# Patient Record
Sex: Male | Born: 1990 | Hispanic: No | Marital: Single | Smoking: Never smoker
Health system: Southern US, Community
[De-identification: ages and names within clinical notes are randomized; demographics above are authoritative.]

---

## 2014-06-30 ENCOUNTER — Emergency Department (INDEPENDENT_AMBULATORY_CARE_PROVIDER_SITE_OTHER): Payer: BLUE CROSS/BLUE SHIELD

## 2014-06-30 ENCOUNTER — Encounter (HOSPITAL_COMMUNITY): Payer: Self-pay | Admitting: Emergency Medicine

## 2014-06-30 ENCOUNTER — Emergency Department (INDEPENDENT_AMBULATORY_CARE_PROVIDER_SITE_OTHER)
Admission: EM | Admit: 2014-06-30 | Discharge: 2014-06-30 | Disposition: A | Discharge status: Home / Self Care | Payer: BLUE CROSS/BLUE SHIELD | Source: Home / Self Care | Attending: Family Medicine | Admitting: Family Medicine

## 2014-06-30 DIAGNOSIS — S62502A Fracture of unspecified phalanx of left thumb, initial encounter for closed fracture: Secondary | ICD-10-CM

## 2014-06-30 DIAGNOSIS — S62609A Fracture of unspecified phalanx of unspecified finger, initial encounter for closed fracture: Secondary | ICD-10-CM

## 2014-06-30 NOTE — Discharge Instructions (Signed)
Leave thumb splinted, see orthopedist this week for recheck.

## 2014-06-30 NOTE — ED Notes (Signed)
Reportedly injured left thumb when attempting to catch a ball yesterday PM. Pain,discoloration, swelling DIP left thumb

## 2014-06-30 NOTE — ED Provider Notes (Signed)
CSN: [id number]     Arrival date & time 06/30/14  1545 History   First MD Initiated Contact with Patient 06/30/14 1615     Chief Complaint  Patient presents with  . Finger Injury   (Consider location/radiation/quality/duration/timing/severity/associated sxs/prior Treatment) Patient is a 23 y.o. male presenting with hand injury. The history is provided by the patient.  Hand Injury Location:  Finger Time since incident:  1 day Injury: yes   Mechanism of injury comment:  Struck with cricket ball to left thumb. Finger location:  L thumb Pain details:    Quality:  Throbbing   Severity:  Moderate   Onset quality:  Sudden Prior injury to area:  No Associated symptoms: stiffness and swelling     History reviewed. No pertinent past medical history. History reviewed. No pertinent past surgical history. History reviewed. No pertinent family history. History  Substance Use Topics  . Smoking status: Never Smoker   . Smokeless tobacco: Not on file  . Alcohol Use: No    Review of Systems  Constitutional: Negative.   Musculoskeletal: Positive for joint swelling and stiffness.  Skin: Negative for wound.    Allergies  Review of patient's allergies indicates no known allergies.  Home Medications   Prior to Admission medications   Not on File   BP 114/77  Pulse 58  Temp(Src) 97.8 F (36.6 C) (Oral)  Resp 14  SpO2 100% Physical Exam  Nursing note and vitals reviewed. Constitutional: He is oriented to person, place, and time. He appears well-developed and well-nourished.  Musculoskeletal: He exhibits tenderness.       Hands: Neurological: He is alert and oriented to person, place, and time.  Skin: Skin is warm and dry.    ED Course  Procedures (including critical care time) Labs Review Labs Reviewed - No data to display  Imaging Review Dg Finger Thumb Left  06/30/2014   CLINICAL DATA:  Baseball injury to the left thumb. Pain in the interphalangeal joint.  EXAM: LEFT  THUMB 2+V  COMPARISON:  None.  FINDINGS: Oblique fracture of the distal phalanx of the thumb extends from the volar metaphysis to the proximal articular surface. Adjacent soft tissue swelling noted. The distal phalanx appears slightly radially subluxed on the proximal phalanx, but certainly not dislocated.  IMPRESSION: 1. Proximal fracture of the distal phalanx of the thumb, extending from the volar proximal metaphysis to the midportion of the proximal articular surface. Adjacent soft tissue swelling. Very slight radial subluxation of the distal phalanx on the middle phalanx may indicate a collateral ligament injury.   Electronically Signed   By: Herbie Baltimore M.D.   On: 06/30/2014 16:40   X-rays reviewed and report per radiologist.   MDM   1. Thumb fracture, left, closed, initial encounter        Linna Hoff, MD 06/30/14 [phone]

## 2015-10-01 IMAGING — CR DG FINGER THUMB 2+V*L*
3 series · 3 of 3 positions shown · non-contrast
Comparison: None.

CLINICAL DATA: Baseball injury to the left thumb. Pain in the
interphalangeal joint.

EXAM:
LEFT THUMB 2+V

[finger ap]
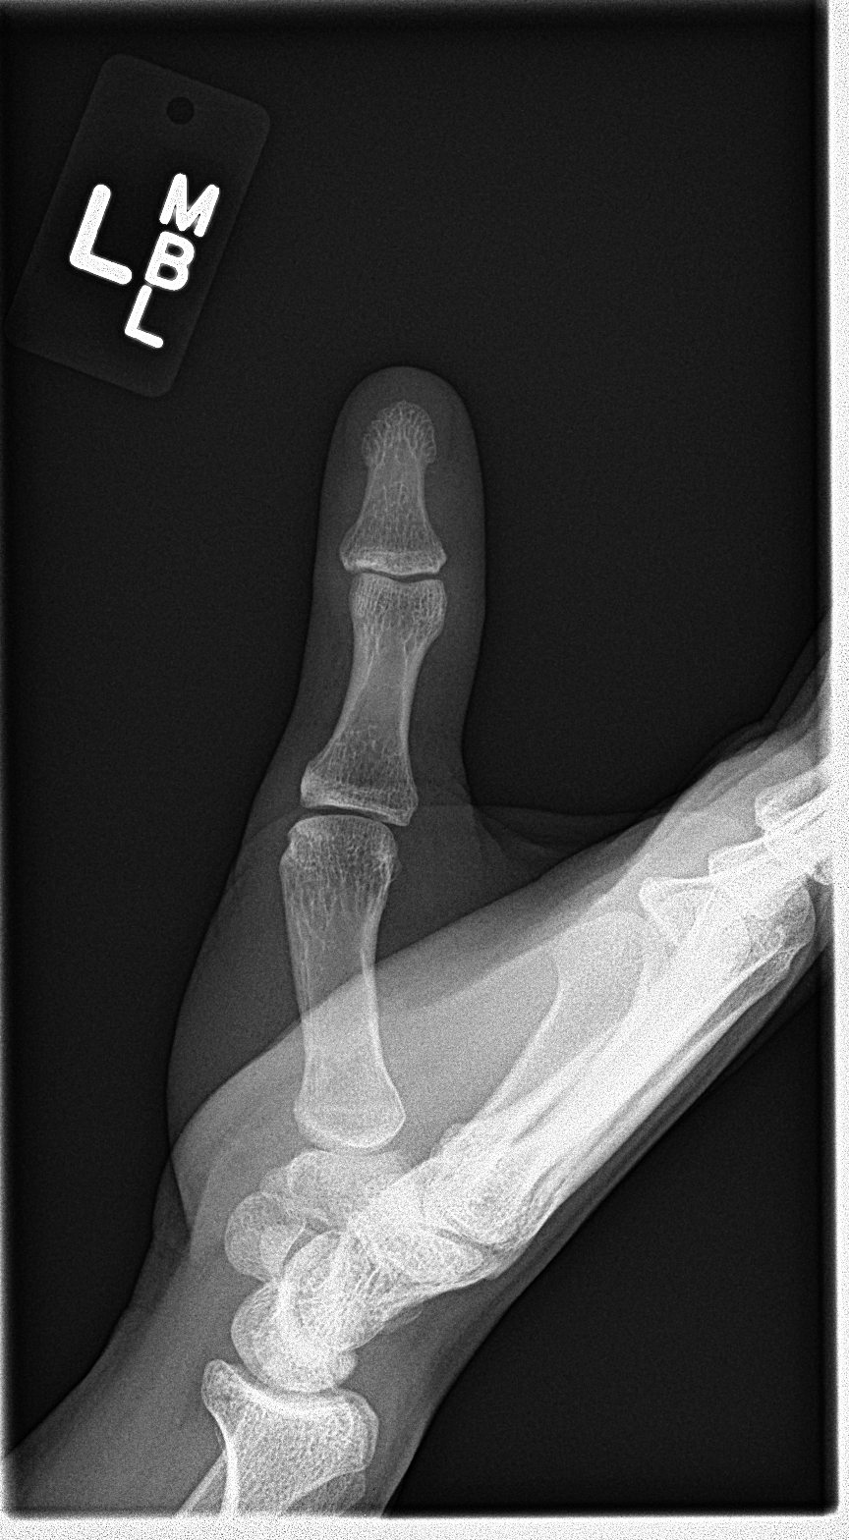

[finger lat]
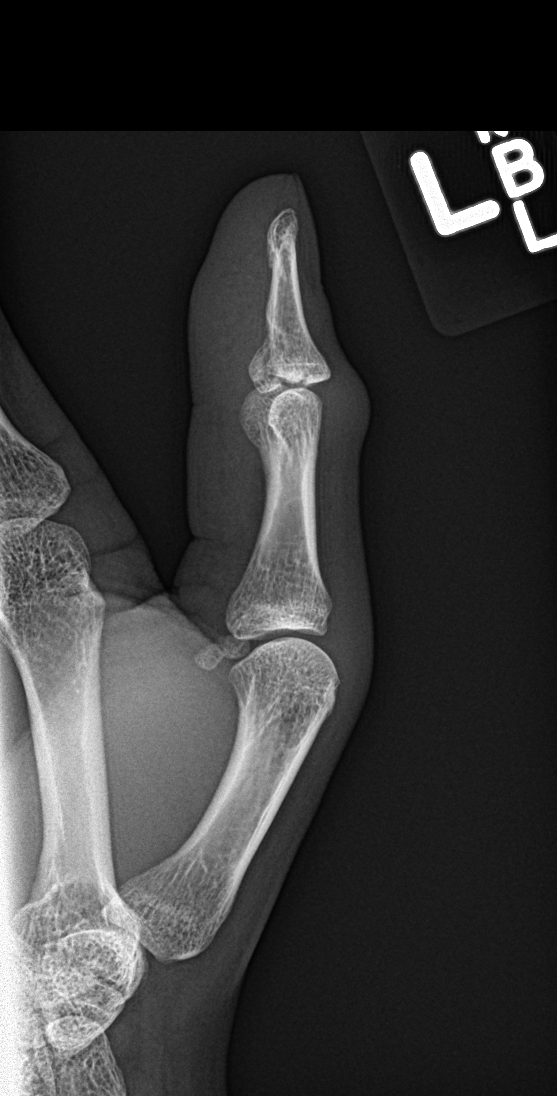

[finger obl]
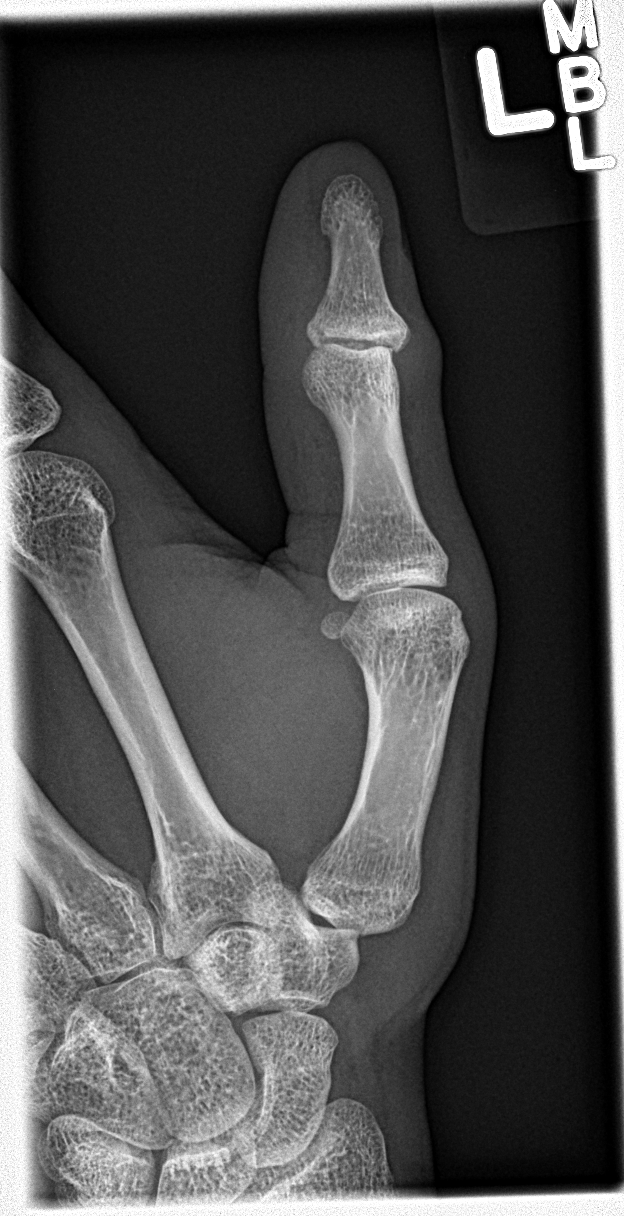

[3 of 3 positions shown; findings below may reference images not displayed]

FINDINGS: Oblique fracture of the distal phalanx of the thumb extends from the
volar metaphysis to the proximal articular surface. Adjacent soft
tissue swelling noted. The distal phalanx appears slightly radially
subluxed on the proximal phalanx, but certainly not dislocated.
IMPRESSION: 1. Proximal fracture of the distal phalanx of the thumb, extending
from the volar proximal metaphysis to the midportion of the proximal
articular surface. Adjacent soft tissue swelling. Very slight radial
subluxation of the distal phalanx on the middle phalanx may indicate
a collateral ligament injury.
